# Patient Record
Sex: Female | Born: 1995 | Race: Black or African American | Hispanic: No | Marital: Single | Smoking: Never smoker
Health system: Southern US, Community
[De-identification: ages and names within clinical notes are randomized; demographics above are authoritative.]

## PROBLEM LIST (undated history)

## (undated) DIAGNOSIS — F909 Attention-deficit hyperactivity disorder, unspecified type: Secondary | ICD-10-CM

---
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
## 2017-02-08 ENCOUNTER — Ambulatory Visit (INDEPENDENT_AMBULATORY_CARE_PROVIDER_SITE_OTHER): Payer: Medicaid Other | Admitting: Pediatric Endocrinology

## 2017-07-09 ENCOUNTER — Encounter (HOSPITAL_COMMUNITY): Payer: Self-pay | Admitting: *Deleted

## 2017-07-09 ENCOUNTER — Ambulatory Visit (HOSPITAL_COMMUNITY)
Admission: EM | Admit: 2017-07-09 | Discharge: 2017-07-09 | Disposition: A | Discharge status: Home / Self Care | Payer: Medicaid Other | Attending: Internal Medicine | Admitting: Internal Medicine

## 2017-07-09 DIAGNOSIS — R350 Frequency of micturition: Secondary | ICD-10-CM

## 2017-07-09 DIAGNOSIS — R300 Dysuria: Secondary | ICD-10-CM

## 2017-07-09 DIAGNOSIS — L989 Disorder of the skin and subcutaneous tissue, unspecified: Secondary | ICD-10-CM | POA: Diagnosis not present

## 2017-07-09 HISTORY — DX: Attention-deficit hyperactivity disorder, unspecified type: F90.9

## 2017-07-09 LAB — POCT URINALYSIS DIP (DEVICE)
GLUCOSE, UA: NEGATIVE mg/dL
KETONES UR: NEGATIVE mg/dL
Leukocytes, UA: NEGATIVE
NITRITE: NEGATIVE
PH: 6 (ref 5.0–8.0)
PROTEIN: 100 mg/dL — AB
Specific Gravity, Urine: >=1.03 (ref 1.005–1.030)
UROBILINOGEN UA: 1 mg/dL (ref 0.0–1.0)

## 2017-07-09 MED ORDER — CEPHALEXIN 500 MG PO CAPS: 500.0000 mg | ORAL_CAPSULE | Freq: Four times a day (QID) | ORAL | 0 refills | Status: AC

## 2017-07-09 NOTE — Discharge Instructions (Signed)
At this time the urinalysis does not indicate a urinary tract infection however your symptoms to suggest this may be a possibility. Treatment will contain antibiotics 3 to take. He should take all of them. Increase her water intake. Also take AZO standard 1 tablet 3 times a day for urinary symptoms. If you are not getting better in 3 days then you may need to follow-up with your primary care doctor. For the lesion on the face follow-up with your primary care doctor, may need referral to dermatologist.

## 2017-07-09 NOTE — ED Triage Notes (Signed)
Symptoms  Of  Urinary  Frequency    And   Burning      On  Urination   X   3  Weeks     Also  Has  A  Boil  On  Face  5 months

## 2017-07-09 NOTE — ED Provider Notes (Signed)
[facility]    CSN: [id number] Arrival date & time: 07/09/17  1211     History   Chief Complaint Chief Complaint  Patient presents with  . Urinary Frequency    HPI Lonnetta Kniskern is a 21 y.o. female.   1408: still attempting to collect urine, not in room. 994:21 year old female presents with significant other with complaints of dysuria, burning with urination, urinary frequency and urgency. This started about 3 weeks ago. Denies abdominal or pelvic pain. No fever or chills. Denies vaginal discharge. LMP was about a week ago and she states she has a scant flow remaining. Her significant other states that she refuses to take a bath she has not taken a bath in a long time and nor has she changed close. He is concerned about her hygiene.      Past Medical History:  Diagnosis Date  . ADHD     Patient Active Problem List   Diagnosis Date Noted  . 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 Elevated hemoglobin A1c 12/09/2016    History reviewed. No pertinent surgical history.  OB History    No data available       Home Medications    Prior to Admission medications   Medication Sig Start Date End Date Taking? Authorizing Provider  RisperiDONE (RISPERDAL PO) Take by mouth.   Yes [provider]  cephALEXin (KEFLEX) 500 MG capsule Take 1 capsule (500 mg total) by mouth 4 (four) times daily. 07/09/17   Hayden Rasmussen, NP    Family History Family History  Problem Relation Age of Onset  . Diabetes Mother   . Seizures Mother   . Bipolar disorder Mother   . Cancer Sister   . Leukemia Sister   . Diabetes Sister   . Gallstones Sister   . Gallstones Maternal Grandmother     Social History Social History  Substance Use Topics  . Smoking status: Never Smoker  . Smokeless tobacco: Never Used  . Alcohol use No     Allergies   Patient has no known allergies.   Review of Systems Review of Systems  Constitutional: Negative.  Negative for chills and  fever.  Cardiovascular: Negative for chest pain and palpitations.  Gastrointestinal: Negative for abdominal pain and vomiting.  Genitourinary: Positive for dysuria, frequency, urgency and vaginal bleeding. Negative for hematuria, pelvic pain and vaginal discharge.       End of menses as stated above.  Musculoskeletal: Negative for arthralgias and back pain.  Skin: Negative for color change and rash.       There is a thickening to the skin to the right side of the face that has been unchanged for the past 6 months. A few weeks ago one of her family members tried to pick it with a pin and only obtain blood. There is been no pus or drainage. The significant other states he has noticed no other changes. There is no pain or tenderness.  Neurological: Negative for seizures and syncope.  All other systems reviewed and are negative.    Physical Exam Triage Vital Signs ED Triage Vitals  Enc Vitals Group     BP 07/09/17 1346 102/70     Pulse Rate 07/09/17 1346 82     Resp 07/09/17 1346 18     Temp 07/09/17 1346 98.3 F (36.8 C)     Temp Source 07/09/17 1346 Oral     SpO2 07/09/17 1346 100 %     Weight --  Height --      Head Circumference --      Peak Flow --      Pain Score 07/09/17 1344 2     Pain Loc --      Pain Edu? --      Excl. in GC? --    No data found.   Updated Vital Signs BP 102/70 (BP Location: Right Arm)   Pulse 82   Temp 98.3 F (36.8 C) (Oral)   Resp 18   LMP 06/30/2017 (Within Days)   SpO2 100%   Visual Acuity Right Eye Distance:   Left Eye Distance:   Bilateral Distance:    Right Eye Near:   Left Eye Near:    Bilateral Near:     Physical Exam  Constitutional: She is oriented to person, place, and time. She appears well-developed and well-nourished. No distress.  Eyes: EOM are normal.  Neck: Normal range of motion. Neck supple.  Cardiovascular: Normal rate.   Pulmonary/Chest: Effort normal. No respiratory distress.  Musculoskeletal: She exhibits  no edema.  Neurological: She is alert and oriented to person, place, and time. She exhibits normal muscle tone.  Skin: Skin is warm and dry.  Irregularly shaped slightly darkened thickened lesion to the right side of the face close to the mandible line. No drainage. No underlying induration and appears to involve the dermis only. Note tenderness, drainage or bleeding. No leaching. Surrounding skin  appears normal.   Psychiatric: She has a normal mood and affect.  Nursing note and vitals reviewed.    UC Treatments / Results  Labs (all labs ordered are listed, but only abnormal results are displayed) Labs Reviewed  POCT URINALYSIS DIP (DEVICE) - Abnormal; Notable for the following:       Result Value   Bilirubin Urine SMALL (*)    Hgb urine dipstick LARGE (*)    Protein, ur 100 (*)    All other components within normal limits  URINE CULTURE    EKG  EKG Interpretation None       Radiology No results found.  Procedures Procedures (including critical care time)  Medications Ordered in UC Medications - No data to display   Initial Impression / Assessment and Plan / UC Course  I have reviewed the triage vital signs and the nursing notes.  Pertinent labs & imaging results that were available during my care of the patient were reviewed by me and considered in my medical decision making (see chart for details).    At this time the urinalysis does not indicate a urinary tract infection however your symptoms to suggest this may be a possibility. Treatment will contain antibiotics 3 to take. He should take all of them. Increase her water intake. Also take AZO standard 1 tablet 3 times a day for urinary symptoms. If you are not getting better in 3 days then you may need to follow-up with your primary care doctor. For the lesion on the face follow-up with your primary care doctor, may need referral to dermatologist.     Final Clinical Impressions(s) / UC Diagnoses   Final  diagnoses:  Urinary frequency  Dysuria  Facial skin lesion    New Prescriptions New Prescriptions   CEPHALEXIN (KEFLEX) 500 MG CAPSULE    Take 1 capsule (500 mg total) by mouth 4 (four) times daily.     Controlled Substance Prescriptions [place] Controlled Substance Registry consulted? Not Applicable   Hayden Rasmussen, NP 07/09/17 1451

## 2017-07-10 LAB — URINE CULTURE

## 2017-11-06 ENCOUNTER — Encounter (HOSPITAL_COMMUNITY): Payer: Self-pay | Admitting: *Deleted

## 2017-11-06 ENCOUNTER — Ambulatory Visit (HOSPITAL_COMMUNITY)
Admission: EM | Admit: 2017-11-06 | Discharge: 2017-11-06 | Disposition: A | Discharge status: Other Acute Inpatient Hospital | Payer: Medicaid Other

## 2017-11-06 ENCOUNTER — Ambulatory Visit (HOSPITAL_COMMUNITY)
Admission: EM | Admit: 2017-11-06 | Discharge: 2017-11-06 | Disposition: A | Discharge status: Home / Self Care | Payer: Medicaid Other | Attending: Family Medicine | Admitting: Family Medicine

## 2017-11-06 DIAGNOSIS — L659 Nonscarring hair loss, unspecified: Secondary | ICD-10-CM | POA: Diagnosis not present

## 2017-11-06 NOTE — ED Triage Notes (Addendum)
Per pt mother hair has been coming out rapidly, per pt mother pt sister hair started falling out due to cancer, per pt father they think it is the perm pt put in her hair 3 wks ago. Per pt she is not having any sx

## 2017-11-10 NOTE — ED Provider Notes (Signed)
  [facility]   [id number] 11/06/17 Arrival Time: 1648  ASSESSMENT & PLAN:  1. Alopecia    Question recent perm hair treatment as trigger. Observe. If symptoms continue she will schedule f/u with her PCP or dermatology. May f/u here as needed.  Reviewed expectations re: course of current medical issues. Questions answered. Outlined signs and symptoms indicating need for more acute intervention. Patient verbalized understanding. After Visit Summary given.   SUBJECTIVE: History mainly from her parents. Megan Giles is a 22 y.o. female who presents with complaint of hair loss for the past 2-3 weeks. She is not too concerned over this but her parents are. They report small areas on scalp where hair is thinning. No scalp itching or pain. Afebrile. No recent illnesses or medication changes. No specific aggravating or alleviating factors reported. Washes hair infrequently.  ROS: As per HPI.  OBJECTIVE: Vitals:   11/06/17 1828  BP: 102/62  Pulse: 90  Temp: 98.7 F (37.1 C)  TempSrc: Oral  SpO2: 100%    General appearance: alert; no distress\ Neck: no thyroid enlargement or masses Lungs: clear to auscultation bilaterally Heart: regular rate and rhythm Extremities: no edema Skin: warm and dry; there are a few 1-1.5 cm circular areas on her scalp where hair appears thinner; no balding spots; can see a few broken hair shafts; no inflammation  Psychological: alert and cooperative; normal mood and affect  No Known Allergies  Past Medical History:  Diagnosis Date  . ADHD    Social History   Socioeconomic History  . Marital status: Single    Spouse name: Not on file  . Number of children: Not on file  . Years of education: Not on file  . Highest education level: Not on file  Social Needs  . Financial resource strain: Not on file  . Food insecurity - worry: Not on file  . Food insecurity - inability: Not on file  . Transportation needs - medical: Not on file  .  Transportation needs - non-medical: Not on file  Occupational History  . Not on file  Tobacco Use  . Smoking status: Never Smoker  . Smokeless tobacco: Never Used  Substance and Sexual Activity  . Alcohol use: No  . Drug use: No  . Sexual activity: No  Other Topics Concern  . Not on file  Social History Narrative  . Not on file   Family History  Problem Relation Age of Onset  . Diabetes Mother   . Seizures Mother   . Bipolar disorder Mother   . Cancer Sister   . Leukemia Sister   . Diabetes Sister   . Gallstones Sister   . Gallstones Maternal Grandmother   . Diabetes Father    History reviewed. No pertinent surgical history.   Mardella LaymanHagler, Megan Engelson, MD 11/10/17 [phone]

## 2018-03-16 DIAGNOSIS — F902 Attention-deficit hyperactivity disorder, combined type: Secondary | ICD-10-CM | POA: Diagnosis not present

## 2018-03-16 DIAGNOSIS — F29 Unspecified psychosis not due to a substance or known physiological condition: Secondary | ICD-10-CM | POA: Diagnosis not present

## 2018-03-16 DIAGNOSIS — F71 Moderate intellectual disabilities: Secondary | ICD-10-CM | POA: Diagnosis not present

## 2018-05-11 DIAGNOSIS — K5900 Constipation, unspecified: Secondary | ICD-10-CM | POA: Diagnosis not present

## 2018-05-11 DIAGNOSIS — L723 Sebaceous cyst: Secondary | ICD-10-CM | POA: Diagnosis not present

## 2018-05-11 DIAGNOSIS — Z0000 Encounter for general adult medical examination without abnormal findings: Secondary | ICD-10-CM | POA: Diagnosis not present

## 2018-05-31 DIAGNOSIS — L723 Sebaceous cyst: Secondary | ICD-10-CM | POA: Diagnosis not present

## 2018-05-31 DIAGNOSIS — Z0000 Encounter for general adult medical examination without abnormal findings: Secondary | ICD-10-CM | POA: Diagnosis not present

## 2018-05-31 DIAGNOSIS — K5900 Constipation, unspecified: Secondary | ICD-10-CM | POA: Diagnosis not present

## 2018-06-06 DIAGNOSIS — F902 Attention-deficit hyperactivity disorder, combined type: Secondary | ICD-10-CM | POA: Diagnosis not present

## 2018-06-06 DIAGNOSIS — F29 Unspecified psychosis not due to a substance or known physiological condition: Secondary | ICD-10-CM | POA: Diagnosis not present

## 2018-06-06 DIAGNOSIS — F71 Moderate intellectual disabilities: Secondary | ICD-10-CM | POA: Diagnosis not present

## 2018-12-19 DIAGNOSIS — F29 Unspecified psychosis not due to a substance or known physiological condition: Secondary | ICD-10-CM | POA: Diagnosis not present

## 2018-12-19 DIAGNOSIS — F71 Moderate intellectual disabilities: Secondary | ICD-10-CM | POA: Diagnosis not present

## 2018-12-19 DIAGNOSIS — F902 Attention-deficit hyperactivity disorder, combined type: Secondary | ICD-10-CM | POA: Diagnosis not present

## 2019-03-14 DIAGNOSIS — F29 Unspecified psychosis not due to a substance or known physiological condition: Secondary | ICD-10-CM | POA: Diagnosis not present

## 2019-03-14 DIAGNOSIS — F71 Moderate intellectual disabilities: Secondary | ICD-10-CM | POA: Diagnosis not present

## 2019-03-14 DIAGNOSIS — F902 Attention-deficit hyperactivity disorder, combined type: Secondary | ICD-10-CM | POA: Diagnosis not present

## 2019-05-30 DIAGNOSIS — F902 Attention-deficit hyperactivity disorder, combined type: Secondary | ICD-10-CM | POA: Diagnosis not present

## 2019-05-30 DIAGNOSIS — F29 Unspecified psychosis not due to a substance or known physiological condition: Secondary | ICD-10-CM | POA: Diagnosis not present

## 2019-05-30 DIAGNOSIS — F71 Moderate intellectual disabilities: Secondary | ICD-10-CM | POA: Diagnosis not present

## 2020-03-28 DIAGNOSIS — R350 Frequency of micturition: Secondary | ICD-10-CM | POA: Diagnosis not present

## 2020-03-28 DIAGNOSIS — Z0000 Encounter for general adult medical examination without abnormal findings: Secondary | ICD-10-CM | POA: Diagnosis not present

## 2020-05-01 DIAGNOSIS — E119 Type 2 diabetes mellitus without complications: Secondary | ICD-10-CM | POA: Diagnosis not present

## 2020-05-13 DIAGNOSIS — E119 Type 2 diabetes mellitus without complications: Secondary | ICD-10-CM | POA: Diagnosis not present

## 2020-06-18 DIAGNOSIS — F71 Moderate intellectual disabilities: Secondary | ICD-10-CM | POA: Diagnosis not present

## 2020-06-18 DIAGNOSIS — F902 Attention-deficit hyperactivity disorder, combined type: Secondary | ICD-10-CM | POA: Diagnosis not present

## 2020-06-18 DIAGNOSIS — F29 Unspecified psychosis not due to a substance or known physiological condition: Secondary | ICD-10-CM | POA: Diagnosis not present

## 2020-07-18 DIAGNOSIS — L910 Hypertrophic scar: Secondary | ICD-10-CM | POA: Diagnosis not present

## 2020-08-06 DIAGNOSIS — L910 Hypertrophic scar: Secondary | ICD-10-CM | POA: Diagnosis not present

## 2020-09-11 ENCOUNTER — Other Ambulatory Visit: Payer: Self-pay

## 2020-09-11 ENCOUNTER — Ambulatory Visit (HOSPITAL_COMMUNITY)
Admission: EM | Admit: 2020-09-11 | Discharge: 2020-09-11 | Disposition: A | Discharge status: Home / Self Care | Payer: Medicare Other | Attending: Family Medicine | Admitting: Family Medicine

## 2020-09-11 ENCOUNTER — Encounter (HOSPITAL_COMMUNITY): Payer: Self-pay

## 2020-09-11 DIAGNOSIS — R059 Cough, unspecified: Secondary | ICD-10-CM | POA: Insufficient documentation

## 2020-09-11 DIAGNOSIS — J069 Acute upper respiratory infection, unspecified: Secondary | ICD-10-CM | POA: Diagnosis not present

## 2020-09-11 DIAGNOSIS — U071 COVID-19: Secondary | ICD-10-CM | POA: Diagnosis not present

## 2020-09-11 LAB — SARS CORONAVIRUS 2 (TAT 6-24 HRS): SARS Coronavirus 2: POSITIVE — AB

## 2020-09-11 MED ORDER — PSEUDOEPH-BROMPHEN-DM 30-2-10 MG/5ML PO SYRP: 5.0000 mL | ORAL_SOLUTION | Freq: Four times a day (QID) | ORAL | 0 refills | Status: AC | PRN

## 2020-09-11 NOTE — ED Provider Notes (Signed)
[facility]   [id number] 09/11/20 Arrival Time: 1152  ASSESSMENT & PLAN:  1. Viral URI with cough      COVID-19 testing sent. See letter/work note on file for self-isolation guidelines. OTC symptom care as needed.  Meds ordered this encounter  Medications   brompheniramine-pseudoephedrine-DM 30-2-10 MG/5ML syrup    Sig: Take 5 mLs by mouth 4 (four) times daily as needed.    Dispense:  90 mL    Refill:  0    May f/u here as needed.  Reviewed expectations re: course of current medical issues. Questions answered. Outlined signs and symptoms indicating need for more acute intervention. Understanding verbalized. After Visit Summary given.   SUBJECTIVE: History from: patient. Megan Giles is a 24 y.o. female who presents with worries regarding COVID-19. Known COVID-19 contact: family member. Recent travel: none. Reports: mild nasal congestion and coughing; coughing bothering her the most. Denies: fever and difficulty breathing. Normal PO intake without n/v/d.    OBJECTIVE:  Vitals:   09/11/20 1240  BP: 136/86  Pulse: 89  Resp: 17  Temp: (!) 97.4 F (36.3 C)  TempSrc: Oral  SpO2: 99%    General appearance: alert; no distress Eyes: PERRLA; EOMI; conjunctiva normal HENT: [place]; AT; with mild nasal congestion Neck: supple  Lungs: speaks full sentences without difficulty; unlabored; dry cough Extremities: no edema Skin: warm and dry Neurologic: normal gait Psychological: alert and cooperative; normal mood and affect  Labs:  Labs Reviewed  SARS CORONAVIRUS 2 (TAT 6-24 HRS)    No Known Allergies  Past Medical History:  Diagnosis Date   ADHD    Social History   Socioeconomic History   Marital status: Single    Spouse name: Not on file   Number of children: Not on file   Years of education: Not on file   Highest education level: Not on file  Occupational History   Not on file  Tobacco Use   Smoking status: Never Smoker   Smokeless  tobacco: Never Used  Vaping Use   Vaping Use: Never used  Substance and Sexual Activity   Alcohol use: No   Drug use: No   Sexual activity: Never  Other Topics Concern   Not on file  Social History Narrative   Not on file   Social Determinants of Health   Financial Resource Strain:    Difficulty of Paying Living Expenses: Not on file  Food Insecurity:    Worried About Running Out of Food in the Last Year: Not on file   Ran Out of Food in the Last Year: Not on file  Transportation Needs:    Lack of Transportation (Medical): Not on file   Lack of Transportation (Non-Medical): Not on file  Physical Activity:    Days of Exercise per Week: Not on file   Minutes of Exercise per Session: Not on file  Stress:    Feeling of Stress : Not on file  Social Connections:    Frequency of Communication with Friends and Family: Not on file   Frequency of Social Gatherings with Friends and Family: Not on file   Attends Religious Services: Not on file   Active Member of Clubs or Organizations: Not on file   Attends Banker Meetings: Not on file   Marital Status: Not on file  Intimate Partner Violence:    Fear of Current or Ex-Partner: Not on file   Emotionally Abused: Not on file   Physically Abused: Not on file  Sexually Abused: Not on file   Family History  Problem Relation Age of Onset   Diabetes Mother    Seizures Mother    Bipolar disorder Mother    Cancer Sister    Leukemia Sister    Diabetes Sister    Gallstones Sister    Gallstones Maternal Grandmother    Diabetes Father    History reviewed. No pertinent surgical history.   Mardella Layman, MD 09/11/20 1352

## 2020-09-11 NOTE — Discharge Instructions (Addendum)
You have been tested for COVID-19 today. °If your test returns positive, you will receive a phone call from [place] regarding your results. °Negative test results are not called. °Both positive and negative results area always visible on MyChart. °If you do not have a MyChart account, sign up instructions are provided in your discharge papers. °Please do not hesitate to contact us should you have questions or concerns. ° °

## 2020-09-11 NOTE — ED Triage Notes (Addendum)
Pt in with c/o dry cough that has been going on for 2 days now.  Pt has tried cough drops with no relief  Denies any n/v, diarrhea, runny nose, st  States that she has been around someone who is sick recently

## 2020-09-12 ENCOUNTER — Other Ambulatory Visit: Payer: Self-pay | Admitting: Nurse Practitioner

## 2020-09-12 DIAGNOSIS — U071 COVID-19: Secondary | ICD-10-CM

## 2020-09-12 NOTE — Progress Notes (Signed)
I connected by phone with Megan Giles on 09/12/2020 at 8:36 AM to discuss the potential use of a treatment for mild to moderate COVID-19 viral infection in non-hospitalized patients.  This patient is a 24 y.o. female that meets the FDA criteria for Emergency Use Authorization of bamlanivimab/etesevimab, casirivimab\imdevimab, or sotrovimab  Has a (+) direct SARS-CoV-2 viral test result  Has mild or moderate COVID-19   Is ? 22 years of age and weighs ? 40 kg  Is NOT hospitalized due to COVID-19  Is NOT requiring oxygen therapy or requiring an increase in baseline oxygen flow rate due to COVID-19  Is within 10 days of symptom onset  Has at least one of the high risk factor(s) for progression to severe COVID-19 and/or hospitalization as defined in EUA.  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 FDA has authorized the emergency use of bamlanivimab/etesevimab, casirivimab\imdevimab, or sotrovimab for the treatment of mild to moderate COVID-19 in adults and pediatric patients with positive results of direct SARS-CoV-2 viral testing who are 42 years of age and older weighing at least 40 kg, and who are at high risk for progressing to severe COVID-19 and/or hospitalization.  2. The significant known and potential risks and benefits of bamlanivimab/etesevimab, casirivimab\imdevimab, or sotrovimab, and the extent to which such potential risks and benefits are unknown.  3. Information on available alternative treatments and the risks and benefits of those alternatives, including clinical trials.  4. Patients treated with bamlanivimab/etesevimab, casirivimab\imdevimab, or sotrovimab should continue to self-isolate and use infection control measures (e.g., wear mask, isolate, social distance, avoid sharing personal items, clean and disinfect "high touch" surfaces, and frequent  handwashing) according to CDC guidelines.   5. The patient or parent/caregiver has the option to accept or refuse bamlanivimab/etesevimab, casirivimab\imdevimab, or sotrovimab.  After reviewing this information with the patient, the patient has agreed to receive one of the available covid 19 monoclonal antibodies and will be provided an appropriate fact sheet prior to infusion.[name], DNP, AGNP-C [phone] (Infusion Center Hotline)

## 2020-09-13 ENCOUNTER — Ambulatory Visit (HOSPITAL_COMMUNITY)
Admission: RE | Admit: 2020-09-13 | Discharge: 2020-09-13 | Disposition: A | Discharge status: Home / Self Care | Payer: Medicare Other | Source: Ambulatory Visit | Attending: Pulmonary Disease | Admitting: Pulmonary Disease

## 2020-09-13 DIAGNOSIS — U071 COVID-19: Secondary | ICD-10-CM

## 2020-09-13 DIAGNOSIS — Z23 Encounter for immunization: Secondary | ICD-10-CM | POA: Insufficient documentation

## 2020-09-13 MED ORDER — EPINEPHRINE 0.3 MG/0.3ML IJ SOAJ: 0.3000 mg | Freq: Once | INTRAMUSCULAR | Status: DC | PRN

## 2020-09-13 MED ORDER — METHYLPREDNISOLONE SODIUM SUCC 125 MG IJ SOLR: 125.0000 mg | Freq: Once | INTRAMUSCULAR | Status: DC | PRN

## 2020-09-13 MED ORDER — SODIUM CHLORIDE 0.9 % IV SOLN: INTRAVENOUS | Status: DC | PRN

## 2020-09-13 MED ORDER — SODIUM CHLORIDE 0.9 % IV BOLUS
1000.0000 mL | Freq: Once | INTRAVENOUS | Status: AC
Start: 2020-09-13 — End: 2020-09-13
  Administered 2020-09-13: 1000 mL via INTRAVENOUS

## 2020-09-13 MED ORDER — DIPHENHYDRAMINE HCL 50 MG/ML IJ SOLN: 50.0000 mg | Freq: Once | INTRAMUSCULAR | Status: DC | PRN

## 2020-09-13 MED ORDER — SOTROVIMAB 500 MG/8ML IV SOLN
500.0000 mg | Freq: Once | INTRAVENOUS | Status: AC
  Administered 2020-09-13: 500 mg via INTRAVENOUS

## 2020-09-13 MED ORDER — FAMOTIDINE IN NACL 20-0.9 MG/50ML-% IV SOLN: 20.0000 mg | Freq: Once | INTRAVENOUS | Status: DC | PRN

## 2020-09-13 MED ORDER — ALBUTEROL SULFATE HFA 108 (90 BASE) MCG/ACT IN AERS: 2.0000 | INHALATION_SPRAY | Freq: Once | RESPIRATORY_TRACT | Status: DC | PRN

## 2020-09-13 NOTE — Progress Notes (Addendum)
Patient reviewed Fact Sheet for Patients, Parents, and Caregivers for Emergency Use Authorization (EUA) of Sotrovimab for the Treatment of Coronavirus. Patient also reviewed and is agreeable to the estimated cost of treatment. Patient is agreeable to proceed.    Called Rexene Alberts, NP regarding pt's HR in the 130s. 1L NS bolus ordered. WCTM.

## 2020-09-13 NOTE — Discharge Instructions (Signed)

## 2020-09-13 NOTE — Progress Notes (Signed)
Diagnosis: COVID-19  Physician: Dr. Patrick Wright  Procedure: Covid Infusion Clinic Med: Sotrovimab infusion - Provided patient with sotrovimab fact sheet for patients, parents, and caregivers prior to infusion.   Complications: No immediate complications noted  Discharge: Discharged home    

## 2020-09-21 ENCOUNTER — Ambulatory Visit (HOSPITAL_COMMUNITY)
Admission: EM | Admit: 2020-09-21 | Discharge: 2020-09-21 | Disposition: A | Discharge status: Home / Self Care | Payer: Medicare Other | Attending: Family Medicine | Admitting: Family Medicine

## 2020-09-21 ENCOUNTER — Other Ambulatory Visit: Payer: Self-pay

## 2020-09-21 ENCOUNTER — Encounter (HOSPITAL_COMMUNITY): Payer: Self-pay | Admitting: *Deleted

## 2020-09-21 DIAGNOSIS — U071 COVID-19: Secondary | ICD-10-CM | POA: Diagnosis not present

## 2020-09-21 DIAGNOSIS — J069 Acute upper respiratory infection, unspecified: Secondary | ICD-10-CM | POA: Insufficient documentation

## 2020-09-21 LAB — RESP PANEL BY RT-PCR (FLU A&B, COVID) ARPGX2
Influenza A by PCR: NEGATIVE
Influenza B by PCR: NEGATIVE
SARS Coronavirus 2 by RT PCR: POSITIVE — AB

## 2020-09-21 NOTE — ED Provider Notes (Signed)
[facility]    CSN: [id number] Arrival date & time: 09/21/20  1308      History   Chief Complaint Chief Complaint  Patient presents with  . Nasal Congestion    chest  . Sore Throat    HPI Megan Giles is a 24 y.o. female.   Here today with 5 day hx of congestion, sore throat, headache, occasional chest tightness. Denies fever, cough, CP, abdominal pain, N/V/D, rashes. Was COVID + 2 weeks ago but sxs resolved prior to onset of these sxs. Has been taking a cough syrup without much relief. No new sick contacts. Had COVID antibody infusion post diagnosis. No known chronic pulmonary conditions.      Past Medical History:  Diagnosis Date  . ADHD     Patient Active Problem List   Diagnosis Date Noted  . 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 Elevated hemoglobin A1c 12/09/2016    History reviewed. No pertinent surgical history.  OB History   No obstetric history on file.      Home Medications    Prior to Admission medications   Medication Sig Start Date End Date Taking? Authorizing Provider  brompheniramine-pseudoephedrine-DM 30-2-10 MG/5ML syrup Take 5 mLs by mouth 4 (four) times daily as needed. 09/11/20   Mardella Layman, MD  cephALEXin (KEFLEX) 500 MG capsule Take 1 capsule (500 mg total) by mouth 4 (four) times daily. 07/09/17   Hayden Rasmussen, NP  RisperiDONE (RISPERDAL PO) Take by mouth.    [provider]    Family History Family History  Problem Relation Age of Onset  . Diabetes Mother   . Seizures Mother   . Bipolar disorder Mother   . Cancer Sister   . Leukemia Sister   . Diabetes Sister   . Gallstones Sister   . Gallstones Maternal Grandmother   . Diabetes Father     Social History Social History   Tobacco Use  . Smoking status: Never Smoker  . Smokeless tobacco: Never Used  Vaping Use  . Vaping Use: Never used  Substance Use Topics  . Alcohol use: No  . Drug use: No     Allergies   Patient has no known allergies.    Review of Systems Review of Systems PER HPI    Physical Exam Triage Vital Signs ED Triage Vitals  Enc Vitals Group     BP 09/21/20 1320 122/75     Pulse Rate 09/21/20 1320 99     Resp 09/21/20 1320 18     Temp 09/21/20 1320 98.3 F (36.8 C)     Temp Source 09/21/20 1320 Oral     SpO2 09/21/20 1320 99 %     Weight 09/21/20 1328 108 lb (49 kg)     Height --      Head Circumference --      Peak Flow --      Pain Score 09/21/20 1323 8     Pain Loc --      Pain Edu? --      Excl. in GC? --    No data found.  Updated Vital Signs BP 122/75 (BP Location: Right Arm)   Pulse 99   Temp 98.3 F (36.8 C) (Oral)   Resp 18   Wt 108 lb (49 kg)   LMP 09/04/2020 (Approximate)   SpO2 99%   BMI 24.40 kg/m   Visual Acuity Right Eye Distance:   Left Eye Distance:   Bilateral Distance:    Right Eye Near:  Left Eye Near:    Bilateral Near:     Physical Exam Vitals and nursing note reviewed.  Constitutional:      Appearance: Normal appearance. She is not ill-appearing.  HENT:     Head: Atraumatic.     Nose: Rhinorrhea present.     Mouth/Throat:     Mouth: Mucous membranes are moist.     Pharynx: Posterior oropharyngeal erythema present.  Eyes:     Extraocular Movements: Extraocular movements intact.     Conjunctiva/sclera: Conjunctivae normal.  Cardiovascular:     Rate and Rhythm: Normal rate and regular rhythm.     Heart sounds: Normal heart sounds.  Pulmonary:     Effort: Pulmonary effort is normal.     Breath sounds: Normal breath sounds. No wheezing or rales.  Abdominal:     General: Bowel sounds are normal. There is no distension.     Palpations: Abdomen is soft.     Tenderness: There is no abdominal tenderness.  Musculoskeletal:        General: Normal range of motion.     Cervical back: Normal range of motion and neck supple.  Skin:    General: Skin is warm and dry.  Neurological:     Mental Status: She is alert and oriented to person, place, and time.   Psychiatric:        Mood and Affect: Mood normal.        Thought Content: Thought content normal.        Judgment: Judgment normal.      UC Treatments / Results  Labs (all labs ordered are listed, but only abnormal results are displayed) Labs Reviewed  INFLUENZA PANEL BY PCR (TYPE A & B)    EKG   Radiology No results found.  Procedures Procedures (including critical care time)  Medications Ordered in UC Medications - No data to display  Initial Impression / Assessment and Plan / UC Course  I have reviewed the triage vital signs and the nursing notes.  Pertinent labs & imaging results that were available during my care of the patient were reviewed by me and considered in my medical decision making (see chart for details).     Vitals, exam reassuring. Suspect viral etiology, will avoid retest for COVID given recent positive. Flu pcr pending, work note given, discussed OTC symptomatic mgmt and supportive care. Return for worsening or not resolving sxs.   Final Clinical Impressions(s) / UC Diagnoses   Final diagnoses:  Viral URI with cough   Discharge Instructions   None    ED Prescriptions    None     PDMP not reviewed this encounter.   Particia Nearing, [place] 09/21/20 1749

## 2020-09-21 NOTE — ED Triage Notes (Signed)
Pt reports chest congestion and sore throat for 5 days . Pt denies cough or N/V.

## 2020-09-22 DIAGNOSIS — Z23 Encounter for immunization: Secondary | ICD-10-CM | POA: Diagnosis not present

## 2020-10-24 DIAGNOSIS — L910 Hypertrophic scar: Secondary | ICD-10-CM | POA: Diagnosis not present

## 2020-12-03 DIAGNOSIS — F29 Unspecified psychosis not due to a substance or known physiological condition: Secondary | ICD-10-CM | POA: Diagnosis not present

## 2020-12-03 DIAGNOSIS — F71 Moderate intellectual disabilities: Secondary | ICD-10-CM | POA: Diagnosis not present

## 2020-12-03 DIAGNOSIS — F902 Attention-deficit hyperactivity disorder, combined type: Secondary | ICD-10-CM | POA: Diagnosis not present

## 2020-12-25 DIAGNOSIS — E119 Type 2 diabetes mellitus without complications: Secondary | ICD-10-CM | POA: Diagnosis not present

## 2021-02-27 DIAGNOSIS — F29 Unspecified psychosis not due to a substance or known physiological condition: Secondary | ICD-10-CM | POA: Diagnosis not present

## 2021-02-27 DIAGNOSIS — F71 Moderate intellectual disabilities: Secondary | ICD-10-CM | POA: Diagnosis not present

## 2021-02-27 DIAGNOSIS — F902 Attention-deficit hyperactivity disorder, combined type: Secondary | ICD-10-CM | POA: Diagnosis not present

## 2021-03-28 DIAGNOSIS — E119 Type 2 diabetes mellitus without complications: Secondary | ICD-10-CM | POA: Diagnosis not present

## 2021-04-01 DIAGNOSIS — L910 Hypertrophic scar: Secondary | ICD-10-CM | POA: Diagnosis not present

## 2021-07-22 DIAGNOSIS — F29 Unspecified psychosis not due to a substance or known physiological condition: Secondary | ICD-10-CM | POA: Diagnosis not present

## 2021-07-22 DIAGNOSIS — F902 Attention-deficit hyperactivity disorder, combined type: Secondary | ICD-10-CM | POA: Diagnosis not present

## 2021-07-22 DIAGNOSIS — F71 Moderate intellectual disabilities: Secondary | ICD-10-CM | POA: Diagnosis not present
